# Patient Record
Sex: Male | Born: 1965
Health system: Southern US, Community
[De-identification: ages and names within clinical notes are randomized; demographics above are authoritative.]

---

## 2016-06-16 MED FILL — ASPIR-LOW 81 MG TABLET EC: 81 | 120 days supply | Qty: 120 | Fill #0

## 2016-06-16 MED FILL — CLINDAMYCIN HCL 300 MG CAPS: 300 | 10 days supply | Qty: 40 | Fill #0

## 2016-06-16 MED FILL — LISINOPRIL 5 MG TABLET: 5 | 30 days supply | Qty: 30 | Fill #0

## 2017-06-04 MED FILL — AMOXICILLIN 500 MG CAPSULE: 500 | 7 days supply | Qty: 28 | Fill #0

## 2017-06-04 MED FILL — IBUPROFEN 800 MG TAB: 800 | 5 days supply | Qty: 20 | Fill #0

## 2017-06-11 MED FILL — traMADol HCL 50 MG TABS: 50 | 2 days supply | Qty: 5 | Fill #0

## 2022-11-10 DIAGNOSIS — S32029A Unspecified fracture of second lumbar vertebra, initial encounter for closed fracture: Secondary | ICD-10-CM

## 2022-11-10 DIAGNOSIS — S52502A Unspecified fracture of the lower end of left radius, initial encounter for closed fracture: Secondary | ICD-10-CM

## 2022-11-10 NOTE — ED Notes (Signed)
Pt has returned from CT without incident 

## 2022-11-10 NOTE — ED Notes (Signed)
Finger traps placed by Dr. Silverio Lay

## 2022-11-10 NOTE — ED Notes (Signed)
Pt ambulated with standby assist in hallway to and from bathroom.

## 2022-11-10 NOTE — Progress Notes (Signed)
Orthopedic Tech Progress Note Patient Details:  Jorge Sanchez Jun 13, 1965 [id number]  Ortho Devices Type of Ortho Device: Finger trap, Sugartong splint, Arm sling Finger Trap Weight: 10 Ortho Device/Splint Location: lue Ortho Device/Splint Interventions: Ordered, Application, Adjustment  The dr put the patient in the finger traps then called me when they were ready to do the reduction and I applied the splint and the dr molded the splint. Post Interventions Patient Tolerated: Well Instructions Provided: Care of device, Adjustment of device  Trinna Post 11/10/2022, 7:53 PM

## 2022-11-10 NOTE — ED Notes (Signed)
X-ray at bedside finishing the remainder of the xrays
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
## 2022-11-10 NOTE — Discharge Instructions (Addendum)
As we discussed, you have left wrist fracture and L2 fracture.  Please keep the splint in place until you see hand surgeon.  You have L2 fracture and please follow-up with neurosurgery.  You are expected to be stiff and sore and then pain tomorrow.  I recommend taking Motrin for pain and Flexeril for muscle spasms.  If you have severe pain you may take oxycodone.  You can take Zofran if you have nausea.  Return to ER if you have worse wrist pain or back pain or trouble walking
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
## 2022-11-10 NOTE — ED Notes (Signed)
Pt is vomiting. Dr. Silverio Lay informed. Zofran ODT ordered
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
## 2022-11-10 NOTE — ED Notes (Signed)
..  The patient is A&OX4, ambulatory at d/c with independent steady gait, NAD. Pt verbalized understanding of d/c instructions, prescriptions and follow up care. Pt wheeled out of ED via wheelchair.

## 2022-11-10 NOTE — ED Notes (Signed)
Patient transported to CT with Anell Barr, Trauma RN

## 2022-11-10 NOTE — ED Notes (Addendum)
Pt asked to urinate in a urinal but stated he was unable to at this time. Call light and urinal within reach

## 2022-11-11 NOTE — Telephone Encounter (Signed)
Transition of care team reports we need a pharmacy change for the oxycodone prescription that was prescribed yesterday.  Will make the change for the patient.

## 2022-11-11 NOTE — Telephone Encounter (Signed)
Patient states that the  pharmacy that the pain medication was sent to did not havvve it in stock.He wants it sent to [facility] on [address] in [place]. Dr Rush Landmark was notified and he has sent it over as ordered.
